# Patient Record
Sex: Female | Born: 1970 | Race: White | Hispanic: Yes | Marital: Married | Smoking: Never smoker
Health system: Southern US, Community
[De-identification: ages and names within clinical notes are randomized; demographics above are authoritative.]

## PROBLEM LIST (undated history)

## (undated) ENCOUNTER — Emergency Department (HOSPITAL_COMMUNITY): Admission: EM | Payer: Self-pay | Source: Home / Self Care

## (undated) DIAGNOSIS — I10 Essential (primary) hypertension: Secondary | ICD-10-CM

## (undated) DIAGNOSIS — E119 Type 2 diabetes mellitus without complications: Secondary | ICD-10-CM

## (undated) HISTORY — PX: TUBAL LIGATION: SHX77

---

## 2010-10-06 HISTORY — PX: APPENDECTOMY: SHX54

## 2011-12-25 ENCOUNTER — Other Ambulatory Visit (HOSPITAL_COMMUNITY): Payer: Self-pay | Admitting: Family Medicine

## 2011-12-25 DIAGNOSIS — Z139 Encounter for screening, unspecified: Secondary | ICD-10-CM

## 2012-01-01 ENCOUNTER — Ambulatory Visit (HOSPITAL_COMMUNITY)
Admission: RE | Admit: 2012-01-01 | Discharge: 2012-01-01 | Disposition: A | Discharge status: Home / Self Care | Payer: Self-pay | Source: Ambulatory Visit | Attending: Family Medicine | Admitting: Family Medicine

## 2012-01-01 DIAGNOSIS — Z139 Encounter for screening, unspecified: Secondary | ICD-10-CM

## 2012-01-05 ENCOUNTER — Other Ambulatory Visit: Payer: Self-pay | Admitting: Family Medicine

## 2012-01-05 DIAGNOSIS — R928 Other abnormal and inconclusive findings on diagnostic imaging of breast: Secondary | ICD-10-CM

## 2012-01-26 ENCOUNTER — Other Ambulatory Visit (HOSPITAL_BASED_OUTPATIENT_CLINIC_OR_DEPARTMENT_OTHER): Payer: Self-pay | Admitting: Internal Medicine

## 2012-01-30 ENCOUNTER — Other Ambulatory Visit (HOSPITAL_COMMUNITY): Payer: Self-pay | Admitting: Family Medicine

## 2012-01-30 DIAGNOSIS — R928 Other abnormal and inconclusive findings on diagnostic imaging of breast: Secondary | ICD-10-CM

## 2012-02-04 ENCOUNTER — Other Ambulatory Visit (HOSPITAL_COMMUNITY): Payer: Self-pay | Admitting: Family Medicine

## 2012-02-04 ENCOUNTER — Ambulatory Visit (HOSPITAL_COMMUNITY)
Admission: RE | Admit: 2012-02-04 | Discharge: 2012-02-04 | Disposition: A | Discharge status: Home / Self Care | Payer: PRIVATE HEALTH INSURANCE | Source: Ambulatory Visit | Attending: Family Medicine | Admitting: Family Medicine

## 2012-02-04 DIAGNOSIS — R928 Other abnormal and inconclusive findings on diagnostic imaging of breast: Secondary | ICD-10-CM | POA: Insufficient documentation

## 2012-02-04 DIAGNOSIS — N6459 Other signs and symptoms in breast: Secondary | ICD-10-CM | POA: Insufficient documentation

## 2013-05-02 ENCOUNTER — Other Ambulatory Visit (HOSPITAL_COMMUNITY): Payer: Self-pay | Admitting: *Deleted

## 2013-05-02 DIAGNOSIS — Z1231 Encounter for screening mammogram for malignant neoplasm of breast: Secondary | ICD-10-CM

## 2013-05-04 ENCOUNTER — Other Ambulatory Visit (HOSPITAL_COMMUNITY): Payer: Self-pay | Admitting: *Deleted

## 2013-05-04 DIAGNOSIS — R928 Other abnormal and inconclusive findings on diagnostic imaging of breast: Secondary | ICD-10-CM

## 2013-05-04 DIAGNOSIS — Z139 Encounter for screening, unspecified: Secondary | ICD-10-CM

## 2013-05-11 ENCOUNTER — Other Ambulatory Visit (HOSPITAL_COMMUNITY): Payer: Self-pay | Admitting: *Deleted

## 2013-05-11 ENCOUNTER — Ambulatory Visit (HOSPITAL_COMMUNITY)
Admission: RE | Admit: 2013-05-11 | Discharge: 2013-05-11 | Disposition: A | Discharge status: Home / Self Care | Payer: PRIVATE HEALTH INSURANCE | Source: Ambulatory Visit | Attending: *Deleted | Admitting: *Deleted

## 2013-05-11 DIAGNOSIS — R928 Other abnormal and inconclusive findings on diagnostic imaging of breast: Secondary | ICD-10-CM

## 2013-05-11 DIAGNOSIS — N63 Unspecified lump in unspecified breast: Secondary | ICD-10-CM | POA: Insufficient documentation

## 2013-05-11 DIAGNOSIS — Z09 Encounter for follow-up examination after completed treatment for conditions other than malignant neoplasm: Secondary | ICD-10-CM | POA: Insufficient documentation

## 2013-11-14 ENCOUNTER — Other Ambulatory Visit (HOSPITAL_COMMUNITY): Payer: Self-pay | Admitting: Nurse Practitioner

## 2013-11-14 DIAGNOSIS — R229 Localized swelling, mass and lump, unspecified: Principal | ICD-10-CM

## 2013-11-14 DIAGNOSIS — IMO0002 Reserved for concepts with insufficient information to code with codable children: Secondary | ICD-10-CM

## 2013-11-16 ENCOUNTER — Other Ambulatory Visit (HOSPITAL_COMMUNITY): Payer: Self-pay | Admitting: Nurse Practitioner

## 2013-11-16 ENCOUNTER — Ambulatory Visit (HOSPITAL_COMMUNITY)
Admission: RE | Admit: 2013-11-16 | Discharge: 2013-11-16 | Disposition: A | Discharge status: Home / Self Care | Payer: PRIVATE HEALTH INSURANCE | Source: Ambulatory Visit | Attending: Nurse Practitioner | Admitting: Nurse Practitioner

## 2013-11-16 DIAGNOSIS — N63 Unspecified lump in unspecified breast: Secondary | ICD-10-CM | POA: Insufficient documentation

## 2013-11-16 DIAGNOSIS — R229 Localized swelling, mass and lump, unspecified: Secondary | ICD-10-CM

## 2013-11-16 DIAGNOSIS — IMO0002 Reserved for concepts with insufficient information to code with codable children: Secondary | ICD-10-CM

## 2013-11-16 DIAGNOSIS — Z09 Encounter for follow-up examination after completed treatment for conditions other than malignant neoplasm: Secondary | ICD-10-CM | POA: Insufficient documentation

## 2014-05-04 ENCOUNTER — Other Ambulatory Visit (HOSPITAL_COMMUNITY): Payer: Self-pay | Admitting: *Deleted

## 2014-05-04 DIAGNOSIS — Z1231 Encounter for screening mammogram for malignant neoplasm of breast: Secondary | ICD-10-CM

## 2014-05-15 ENCOUNTER — Ambulatory Visit (HOSPITAL_COMMUNITY): Payer: Self-pay

## 2014-05-18 ENCOUNTER — Ambulatory Visit (HOSPITAL_COMMUNITY)
Admission: RE | Admit: 2014-05-18 | Discharge: 2014-05-18 | Disposition: A | Discharge status: Home / Self Care | Payer: PRIVATE HEALTH INSURANCE | Source: Ambulatory Visit | Attending: *Deleted | Admitting: *Deleted

## 2014-05-18 DIAGNOSIS — Z1231 Encounter for screening mammogram for malignant neoplasm of breast: Secondary | ICD-10-CM | POA: Insufficient documentation

## 2015-07-10 ENCOUNTER — Other Ambulatory Visit (HOSPITAL_COMMUNITY): Payer: Self-pay | Admitting: *Deleted

## 2015-07-10 DIAGNOSIS — Z1231 Encounter for screening mammogram for malignant neoplasm of breast: Secondary | ICD-10-CM

## 2015-07-18 ENCOUNTER — Ambulatory Visit (HOSPITAL_COMMUNITY)
Admission: RE | Admit: 2015-07-18 | Discharge: 2015-07-18 | Disposition: A | Discharge status: Home / Self Care | Payer: Self-pay | Source: Ambulatory Visit | Attending: *Deleted | Admitting: *Deleted

## 2015-07-18 DIAGNOSIS — Z1231 Encounter for screening mammogram for malignant neoplasm of breast: Secondary | ICD-10-CM

## 2016-07-17 ENCOUNTER — Other Ambulatory Visit (HOSPITAL_COMMUNITY): Payer: Self-pay | Admitting: *Deleted

## 2016-07-17 DIAGNOSIS — Z1231 Encounter for screening mammogram for malignant neoplasm of breast: Secondary | ICD-10-CM

## 2016-07-23 ENCOUNTER — Ambulatory Visit (HOSPITAL_COMMUNITY)
Admission: RE | Admit: 2016-07-23 | Discharge: 2016-07-23 | Disposition: A | Discharge status: Home / Self Care | Payer: Self-pay | Source: Ambulatory Visit | Attending: *Deleted | Admitting: *Deleted

## 2016-07-23 DIAGNOSIS — Z1231 Encounter for screening mammogram for malignant neoplasm of breast: Secondary | ICD-10-CM

## 2017-08-11 ENCOUNTER — Other Ambulatory Visit (HOSPITAL_COMMUNITY): Payer: Self-pay | Admitting: *Deleted

## 2017-08-11 DIAGNOSIS — Z1231 Encounter for screening mammogram for malignant neoplasm of breast: Secondary | ICD-10-CM

## 2017-08-20 ENCOUNTER — Ambulatory Visit (HOSPITAL_COMMUNITY): Payer: Self-pay

## 2017-08-24 ENCOUNTER — Encounter (HOSPITAL_COMMUNITY): Payer: Self-pay

## 2017-08-24 ENCOUNTER — Ambulatory Visit (HOSPITAL_COMMUNITY)
Admission: RE | Admit: 2017-08-24 | Discharge: 2017-08-24 | Disposition: A | Discharge status: Home / Self Care | Payer: PRIVATE HEALTH INSURANCE | Source: Ambulatory Visit | Attending: *Deleted | Admitting: *Deleted

## 2017-08-24 DIAGNOSIS — Z1231 Encounter for screening mammogram for malignant neoplasm of breast: Secondary | ICD-10-CM

## 2019-05-02 ENCOUNTER — Encounter (HOSPITAL_COMMUNITY): Payer: Self-pay | Admitting: Emergency Medicine

## 2019-05-02 ENCOUNTER — Emergency Department (HOSPITAL_COMMUNITY): Payer: Self-pay

## 2019-05-02 ENCOUNTER — Other Ambulatory Visit: Payer: Self-pay

## 2019-05-02 ENCOUNTER — Emergency Department (HOSPITAL_COMMUNITY)
Admission: EM | Admit: 2019-05-02 | Discharge: 2019-05-02 | Disposition: A | Discharge status: Home / Self Care | Payer: Self-pay | Attending: Emergency Medicine | Admitting: Emergency Medicine

## 2019-05-02 DIAGNOSIS — Z79899 Other long term (current) drug therapy: Secondary | ICD-10-CM | POA: Insufficient documentation

## 2019-05-02 DIAGNOSIS — I10 Essential (primary) hypertension: Secondary | ICD-10-CM | POA: Insufficient documentation

## 2019-05-02 DIAGNOSIS — E119 Type 2 diabetes mellitus without complications: Secondary | ICD-10-CM | POA: Insufficient documentation

## 2019-05-02 DIAGNOSIS — Z7984 Long term (current) use of oral hypoglycemic drugs: Secondary | ICD-10-CM | POA: Insufficient documentation

## 2019-05-02 DIAGNOSIS — R002 Palpitations: Secondary | ICD-10-CM | POA: Insufficient documentation

## 2019-05-02 HISTORY — DX: Essential (primary) hypertension: I10

## 2019-05-02 HISTORY — DX: Type 2 diabetes mellitus without complications: E11.9

## 2019-05-02 LAB — CBC WITH DIFFERENTIAL/PLATELET
Abs Immature Granulocytes: 0.06 10*3/uL (ref 0.00–0.07)
Basophils Absolute: 0.1 10*3/uL (ref 0.0–0.1)
Basophils Relative: 1 %
Eosinophils Absolute: 0.5 10*3/uL (ref 0.0–0.5)
Eosinophils Relative: 4 %
HCT: 37.2 % (ref 36.0–46.0)
Hemoglobin: 11.7 g/dL — ABNORMAL LOW (ref 12.0–15.0)
Immature Granulocytes: 1 %
Lymphocytes Relative: 22 %
Lymphs Abs: 2.6 10*3/uL (ref 0.7–4.0)
MCH: 24.8 pg — ABNORMAL LOW (ref 26.0–34.0)
MCHC: 31.5 g/dL (ref 30.0–36.0)
MCV: 78.8 fL — ABNORMAL LOW (ref 80.0–100.0)
Monocytes Absolute: 0.6 10*3/uL (ref 0.1–1.0)
Monocytes Relative: 5 %
Neutro Abs: 8.1 10*3/uL — ABNORMAL HIGH (ref 1.7–7.7)
Neutrophils Relative %: 67 %
Platelets: 417 10*3/uL — ABNORMAL HIGH (ref 150–400)
RBC: 4.72 MIL/uL (ref 3.87–5.11)
RDW: 14.2 % (ref 11.5–15.5)
WBC: 11.9 10*3/uL — ABNORMAL HIGH (ref 4.0–10.5)
nRBC: 0 % (ref 0.0–0.2)

## 2019-05-02 LAB — I-STAT BETA HCG BLOOD, ED (MC, WL, AP ONLY): I-stat hCG, quantitative: <5 m[IU]/mL (ref <5)

## 2019-05-02 LAB — BASIC METABOLIC PANEL
Anion gap: 12 (ref 5–15)
BUN: 11 mg/dL (ref 6–20)
CO2: 25 mmol/L (ref 22–32)
Calcium: 9.6 mg/dL (ref 8.9–10.3)
Chloride: 98 mmol/L (ref 98–111)
Creatinine, Ser: 0.39 mg/dL — ABNORMAL LOW (ref 0.44–1.00)
GFR calc Af Amer: >60 mL/min (ref >60)
GFR calc non Af Amer: >60 mL/min (ref >60)
Glucose, Bld: 222 mg/dL — ABNORMAL HIGH (ref 70–99)
Potassium: 3.9 mmol/L (ref 3.5–5.1)
Sodium: 135 mmol/L (ref 135–145)

## 2019-05-02 LAB — MAGNESIUM: Magnesium: 1.4 mg/dL — ABNORMAL LOW (ref 1.7–2.4)

## 2019-05-02 MED ORDER — MAGNESIUM CHLORIDE 64 MG PO TBEC: 2.0000 | DELAYED_RELEASE_TABLET | Freq: Two times a day (BID) | ORAL | 0 refills | Status: AC

## 2019-05-02 NOTE — ED Provider Notes (Signed)
[facility] EMERGENCY DEPARTMENT Provider Note   CSN: [id number] Arrival date & time: 05/02/19  1232    History   Chief Complaint Chief Complaint  Patient presents with  . Palpitations    HPI Kaitlyn Bell is a 48 y.o. female.     The history is provided by the patient. A language interpreter was used ([place]).     Kaitlyn Bell is a 48 y.o. female, with a history of DM and HTN, presenting to the ED with two episodes of rapid heart rate.  She has experienced 2 such episodes.  The first episode woke her up from sleep about 2 weeks ago and she estimates this lasted for about 30 minutes.  It had sudden onset and sudden resolution.   The second episode occurred about a week ago while at rest and lasted for approximately 5 minutes.  She denies any changes in activity.  No new medications or supplements.  She drinks a diet cola about once every 3 days and this has not recently changed.  Denies other caffeine use.  She has been eating and drinking normally. LMP July 10. Denies fever/chills, N/V/D, chest pain, shortness of breath, cough, dizziness, headache, abdominal pain, hematochezia/melena, syncope, or any other complaints.  She denies the above symptoms currently as well as during her episodes of rapid heart rate.    Past Medical History:  Diagnosis Date  . Diabetes mellitus without complication ([place])   . Hypertension     There are no active problems to display for this patient.   Past Surgical History:  Procedure Laterality Date  . APPENDECTOMY  2012     OB History   No obstetric history on file.      Home Medications    Prior to Admission medications   Medication Sig Start Date End Date Taking? Authorizing Provider  glipiZIDE (GLUCOTROL) 10 MG tablet Take 10 mg by mouth 2 (two) times daily before a meal.   Yes [provider]  lisinopril (ZESTRIL) 5 MG tablet Take 5 mg by mouth daily.   Yes [provider]   simvastatin (ZOCOR) 10 MG tablet Take 10 mg by mouth daily.   Yes [provider]  sitaGLIPtin-metformin (JANUMET) 50-1000 MG tablet Take 2 tablets by mouth 2 (two) times daily with a meal.   Yes [provider]  magnesium chloride (SLOW-MAG) 64 MG TBEC SR tablet Take 2 tablets (128 mg total) by mouth 2 (two) times daily. 05/02/19 06/01/19  Lorayne Bender, PA-C    Family History History reviewed. No pertinent family history.  Social History Social History   Tobacco Use  . Smoking status: Never Smoker  . Smokeless tobacco: Never Used  Substance Use Topics  . Alcohol use: Never    Frequency: Never  . Drug use: Never     Allergies   Patient has no known allergies.   Review of Systems Review of Systems  Constitutional: Negative for chills and fever.  Respiratory: Negative for cough and shortness of breath.   Cardiovascular: Positive for palpitations. Negative for chest pain and leg swelling.  Gastrointestinal: Negative for abdominal pain, blood in stool, diarrhea, nausea and vomiting.  Endocrine: Negative for polyuria.  Genitourinary: Negative for dysuria, frequency and hematuria.  Neurological: Negative for dizziness, syncope, weakness, light-headedness and headaches.  All other systems reviewed and are negative.    Physical Exam Updated Vital Signs BP 138/82 (BP Location: Right Arm)   Pulse 94   Temp 97.9 F (36.6 C) (Oral)  Resp 18   Wt 57 kg   SpO2 100%   Physical Exam Vitals signs and nursing note reviewed.  Constitutional:      General: She is not in acute distress.    Appearance: She is well-developed. She is not diaphoretic.  HENT:     Head: Normocephalic and atraumatic.     Mouth/Throat:     Mouth: Mucous membranes are moist.     Pharynx: Oropharynx is clear.  Eyes:     Conjunctiva/sclera: Conjunctivae normal.  Neck:     Musculoskeletal: Neck supple.  Cardiovascular:     Rate and Rhythm: Normal rate and regular rhythm.     Pulses:  Normal pulses.          Radial pulses are 2+ on the right side and 2+ on the left side.       Posterior tibial pulses are 2+ on the right side and 2+ on the left side.     Heart sounds: Normal heart sounds.     Comments: Tactile temperature in the extremities appropriate and equal bilaterally. Pulmonary:     Effort: Pulmonary effort is normal. No respiratory distress.     Breath sounds: Normal breath sounds.  Abdominal:     Palpations: Abdomen is soft.     Tenderness: There is no abdominal tenderness. There is no guarding.  Musculoskeletal:     Right lower leg: No edema.     Left lower leg: No edema.  Lymphadenopathy:     Cervical: No cervical adenopathy.  Skin:    General: Skin is warm and dry.  Neurological:     Mental Status: She is alert.  Psychiatric:        Mood and Affect: Mood and affect normal.        Speech: Speech normal.        Behavior: Behavior normal.      ED Treatments / Results  Labs (all labs ordered are listed, but only abnormal results are displayed) Labs Reviewed  BASIC METABOLIC PANEL - Abnormal; Notable for the following components:      Result Value   Glucose, Bld 222 (*)    Creatinine, Ser 0.39 (*)    All other components within normal limits  CBC WITH DIFFERENTIAL/PLATELET - Abnormal; Notable for the following components:   WBC 11.9 (*)    Hemoglobin 11.7 (*)    MCV 78.8 (*)    MCH 24.8 (*)    Platelets 417 (*)    Neutro Abs 8.1 (*)    All other components within normal limits  MAGNESIUM - Abnormal; Notable for the following components:   Magnesium 1.4 (*)    All other components within normal limits  I-STAT BETA HCG BLOOD, ED ([facility], [facility], [facility] ONLY)    EKG EKG Interpretation  Date/Time:  Monday May 02 2019 12:53:33 EDT Ventricular Rate:  95 PR Interval:    QRS Duration: 84 QT Interval:  350 QTC Calculation: 440 R Axis:   66 Text Interpretation:  Sinus rhythm Baseline wander in lead(s) II Confirmed by Bethann BerkshireZammit, Joseph [phone]([id number]) on  05/02/2019 2:17:13 PM   Radiology Dg Chest 2 View  Result Date: 05/02/2019 CLINICAL DATA:  Pt reports having feeling of heart racing at times x 2 weeks. Hx HTN, DM EXAM: CHEST - 2 VIEW COMPARISON:  03/02/2008 FINDINGS: Heart size is normal. The lungs are clear. No pulmonary edema. Surgical clips are present in the RIGHT UPPER QUADRANT of the abdomen. IMPRESSION: No active cardiopulmonary disease. Electronically Signed  By: Norva PavlovElizabeth  Brown M.D.   On: 05/02/2019 13:54    Procedures Procedures (including critical care time)  Medications Ordered in ED Medications - No data to display   Initial Impression / Assessment and Plan / ED Course  I have reviewed the triage vital signs and the nursing notes.  Pertinent labs & imaging results that were available during my care of the patient were reviewed by me and considered in my medical decision making (see chart for details).        Patient presents with a complaint of episodic rapid heart rate.  These symptoms are nonexertional and self resolved. Patient is nontoxic appearing, afebrile, not tachycardic, not tachypneic, not hypotensive, maintains excellent SPO2 on room air, and is in no apparent distress.   Mild leukocytosis is nonspecific at this time, especially since patient was symptom-free throughout ED course. Mild anemia noted without previous values with which to compare.  Patient denies any source of persistent bleeding.  No thrombocytopenia. Hypomagnesemia of 1.4 with potassium and calcium within normal limits.   EKG without evidence of conduction delays or arrhythmia.  Magnesium supplementation prescription provided.  Patient advised to have retesting performed ideally after 1 to 2 weeks of supplementation.  PCP versus cardiology follow-up. The patient was given instructions for home care as well as return precautions. Patient voices understanding of these instructions, accepts the plan, and is comfortable with discharge.   2:39  PM Lab results, chest x-ray, and EKG as well as discharge instructions were discussed using Spanish interpreter De NurseMarco [phone].   Patient was offered testing for COVID-19 with explanation, but declined.   Vitals:   05/02/19 1330 05/02/19 1417 05/02/19 1430 05/02/19 1500  BP: 99/79 98/70 114/78 104/68  Pulse: 91 91 95 95  Resp: 19 18 18 19   Temp:      TempSrc:      SpO2: 99% 98% 97% 96%  Weight:         Final Clinical Impressions(s) / ED Diagnoses   Final diagnoses:  Palpitations  Hypomagnesemia    ED Discharge Orders         Ordered    magnesium chloride (SLOW-MAG) 64 MG TBEC SR tablet  2 times daily     05/02/19 1422           Anselm PancoastJoy, Shawn C, PA-C 05/02/19 1516    Bethann BerkshireZammit, Joseph, MD 05/04/19 [id number]

## 2019-05-02 NOTE — ED Triage Notes (Signed)
Pt reports having feeling of heart racing at times x 2 weeks.

## 2019-05-02 NOTE — Discharge Instructions (Signed)
You were seen today for episodes of fast heart rate.  Chest x-ray and EKG were reassuring.  There was some evidence of low magnesium.  We will have you begin magnesium supplementation.  This value will need to be retested within the next week or two. We recommend you follow-up with your primary care provider on this matter as soon as possible.  May also need to follow-up with cardiology should symptoms persist.  Return to the emergency department for sustained symptoms, passing out, chest pain, shortness of breath, dizziness, or any other major concerns.

## 2019-11-07 ENCOUNTER — Ambulatory Visit: Payer: PRIVATE HEALTH INSURANCE | Attending: Internal Medicine

## 2019-11-07 ENCOUNTER — Other Ambulatory Visit: Payer: Self-pay

## 2019-11-07 DIAGNOSIS — Z20822 Contact with and (suspected) exposure to covid-19: Secondary | ICD-10-CM

## 2019-11-08 ENCOUNTER — Telehealth: Payer: Self-pay

## 2019-11-08 LAB — NOVEL CORONAVIRUS, NAA: SARS-CoV-2, NAA: NOT DETECTED

## 2019-11-08 NOTE — Telephone Encounter (Signed)
Patient called with daughter to help. She was told that her test for COVID-19  11/07/19 was still pending.  Daughter verbalized understanding and will call back.

## 2019-11-09 ENCOUNTER — Telehealth: Payer: Self-pay

## 2019-11-09 NOTE — Telephone Encounter (Signed)
Pt notified of negative COVID-19 results. Understanding verbalized.  Chasta M Hopkins   

## 2019-12-13 ENCOUNTER — Other Ambulatory Visit (HOSPITAL_COMMUNITY): Payer: Self-pay | Admitting: *Deleted

## 2019-12-13 DIAGNOSIS — Z1231 Encounter for screening mammogram for malignant neoplasm of breast: Secondary | ICD-10-CM

## 2019-12-26 ENCOUNTER — Other Ambulatory Visit: Payer: Self-pay

## 2019-12-26 ENCOUNTER — Ambulatory Visit (HOSPITAL_COMMUNITY)
Admission: RE | Admit: 2019-12-26 | Discharge: 2019-12-26 | Disposition: A | Discharge status: Home / Self Care | Payer: PRIVATE HEALTH INSURANCE | Source: Ambulatory Visit | Attending: *Deleted | Admitting: *Deleted

## 2019-12-26 DIAGNOSIS — Z1231 Encounter for screening mammogram for malignant neoplasm of breast: Secondary | ICD-10-CM | POA: Insufficient documentation

## 2021-02-15 ENCOUNTER — Other Ambulatory Visit (HOSPITAL_COMMUNITY): Payer: Self-pay | Admitting: *Deleted

## 2021-02-15 DIAGNOSIS — Z1231 Encounter for screening mammogram for malignant neoplasm of breast: Secondary | ICD-10-CM

## 2021-02-25 ENCOUNTER — Other Ambulatory Visit: Payer: Self-pay

## 2021-02-25 ENCOUNTER — Ambulatory Visit (HOSPITAL_COMMUNITY)
Admission: RE | Admit: 2021-02-25 | Discharge: 2021-02-25 | Disposition: A | Discharge status: Home / Self Care | Payer: Self-pay | Source: Ambulatory Visit | Attending: *Deleted | Admitting: *Deleted

## 2021-02-25 DIAGNOSIS — Z1231 Encounter for screening mammogram for malignant neoplasm of breast: Secondary | ICD-10-CM | POA: Insufficient documentation

## 2022-01-01 ENCOUNTER — Ambulatory Visit: Payer: Self-pay | Admitting: Family Medicine

## 2022-05-20 ENCOUNTER — Other Ambulatory Visit (HOSPITAL_COMMUNITY): Payer: Self-pay | Admitting: Obstetrics and Gynecology

## 2022-05-20 DIAGNOSIS — Z1231 Encounter for screening mammogram for malignant neoplasm of breast: Secondary | ICD-10-CM

## 2022-06-20 ENCOUNTER — Inpatient Hospital Stay: Payer: Self-pay | Attending: Obstetrics and Gynecology | Admitting: Hematology and Oncology

## 2022-06-20 ENCOUNTER — Ambulatory Visit (HOSPITAL_COMMUNITY): Admission: RE | Admit: 2022-06-20 | Payer: Self-pay | Source: Ambulatory Visit

## 2022-06-20 VITALS — BP 106/70 | Wt 156.0 lb

## 2022-06-20 DIAGNOSIS — Z1231 Encounter for screening mammogram for malignant neoplasm of breast: Secondary | ICD-10-CM

## 2022-06-20 NOTE — Progress Notes (Signed)
Kaitlyn Bell is a 51 y.o. female who presents to [facility] clinic today with no complaints .    Pap Smear: Pap not smear completed today. Last Pap smear was 12/13/2019 at [facility] Department clinic and was normal. Per patient has no history of an abnormal Pap smear. Last Pap smear result is not available in Epic.   Physical exam: Breasts Breasts symmetrical. No skin abnormalities bilateral breasts. No nipple retraction bilateral breasts. No nipple discharge bilateral breasts. No lymphadenopathy. No lumps palpated bilateral breasts.       Pelvic/Bimanual Pap is not indicated today    Smoking History: Patient has never smoked and was not referred to quit line.    Patient Navigation: Patient education provided. Access to services provided for patient through [facility] program. No interpreter provided. No transportation provided   Colorectal Cancer Screening: Per patient has never had colonoscopy completed No complaints today. Refused FIT test today.   Breast and Cervical Cancer Risk Assessment: Patient does not have family history of breast cancer, known genetic mutations, or radiation treatment to the chest before age 35. Patient does not have history of cervical dysplasia, immunocompromised, or DES exposure in-utero.  Risk Assessment     Risk Scores       06/20/2022   Last edited by: Narda Rutherford, LPN   5-year risk: 0.6 %   Lifetime risk: 5.7 %            A: B[facility]CP exam without pap smear No complaints with benign exam.   P: [facility]. Appointment scheduled for 06/20/2022.  Pascal Lux, NP 06/20/2022 11:41 AM

## 2022-07-09 ENCOUNTER — Ambulatory Visit (HOSPITAL_COMMUNITY)
Admission: RE | Admit: 2022-07-09 | Discharge: 2022-07-09 | Disposition: A | Discharge status: Home / Self Care | Payer: Self-pay | Source: Ambulatory Visit | Attending: Obstetrics and Gynecology | Admitting: Obstetrics and Gynecology

## 2022-07-09 DIAGNOSIS — Z1231 Encounter for screening mammogram for malignant neoplasm of breast: Secondary | ICD-10-CM | POA: Insufficient documentation

## 2022-09-06 IMAGING — MG MM DIGITAL SCREENING BILAT W/ TOMO AND CAD
8 series · 8 of 24 positions shown · non-contrast
Comparison: Previous exam(s).

CLINICAL DATA: Screening.

EXAM:
DIGITAL SCREENING BILATERAL MAMMOGRAM WITH TOMOSYNTHESIS AND CAD
TECHNIQUE: Bilateral screening digital craniocaudal and mediolateral oblique
mammograms were obtained. Bilateral screening digital breast
tomosynthesis was performed. The images were evaluated with
computer-aided detection.

[L MLO synth-2D]
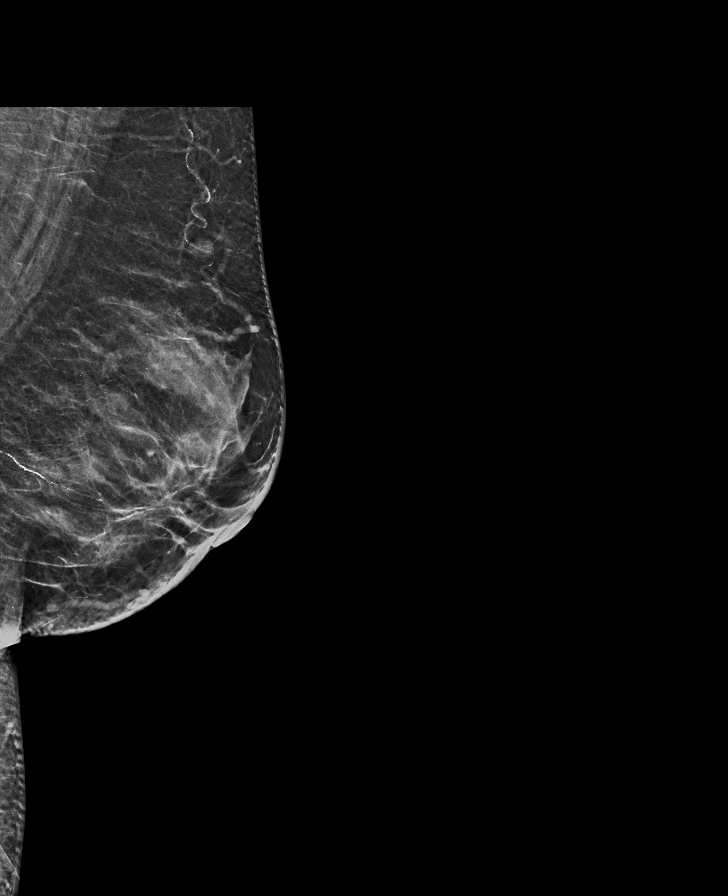

[L CC synth-2D]
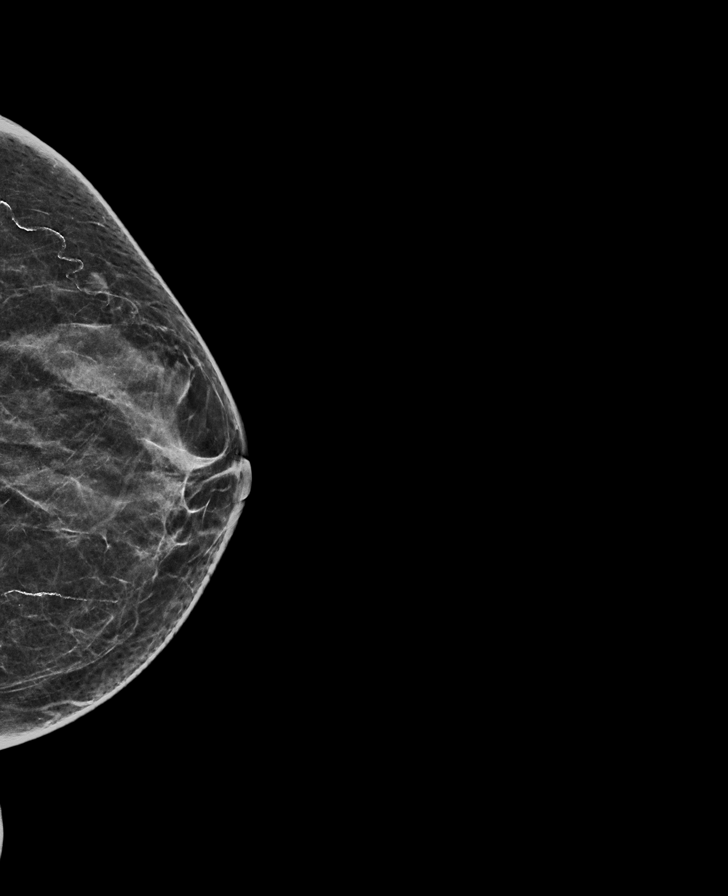

[R MLO synth-2D]
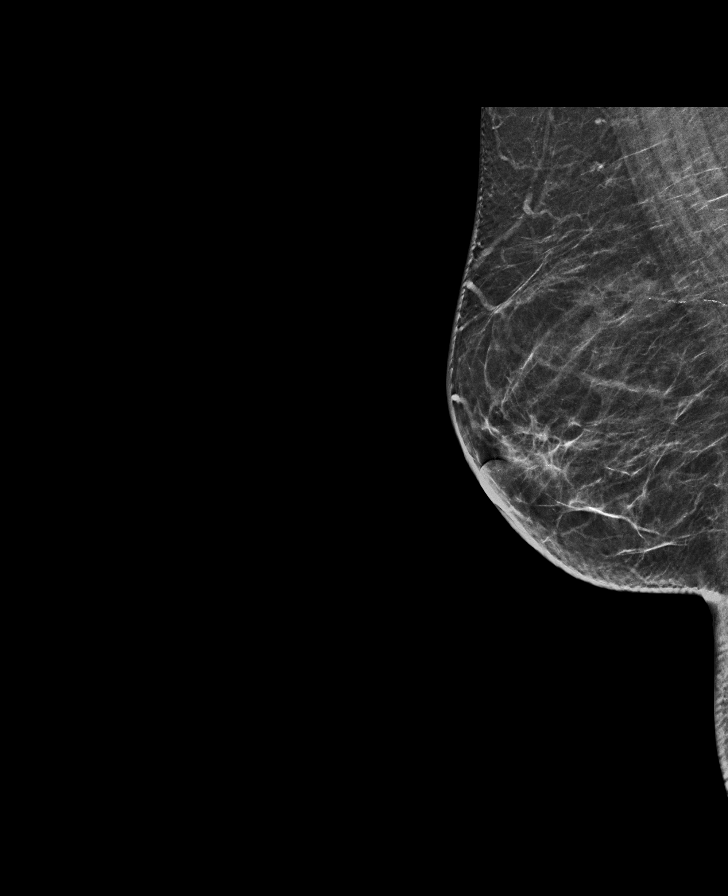

[R CC synth-2D]
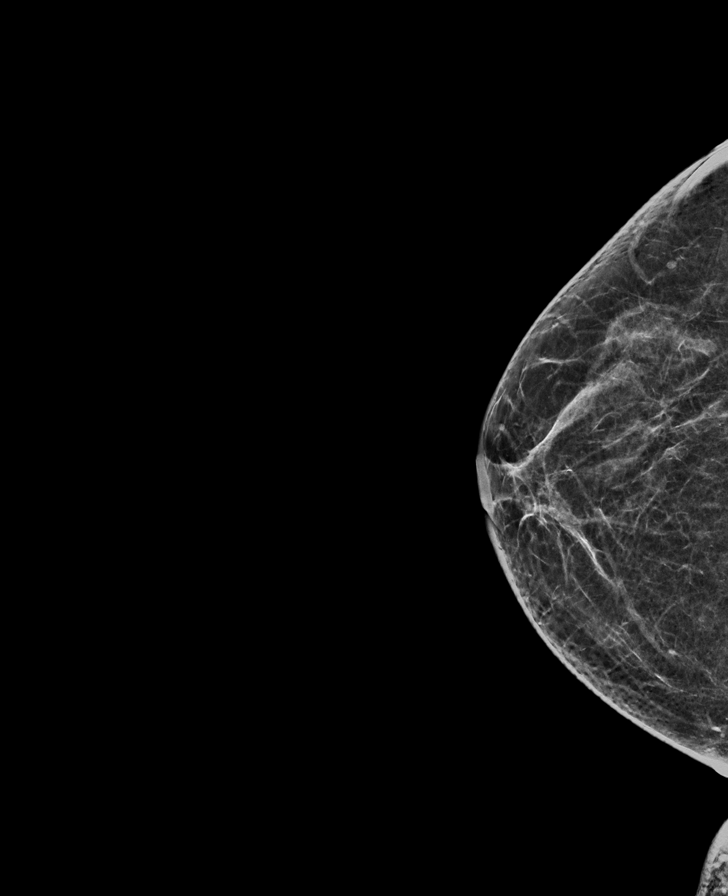

[R MLO tomo · tomo slice 29/56.0]
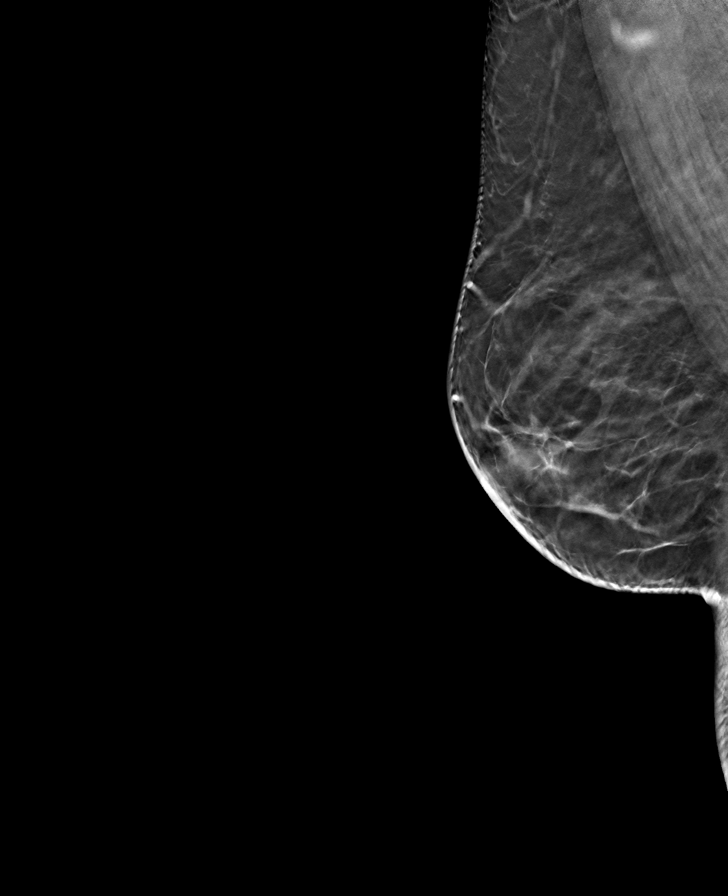

[L MLO tomo · tomo slice 29/57.0]
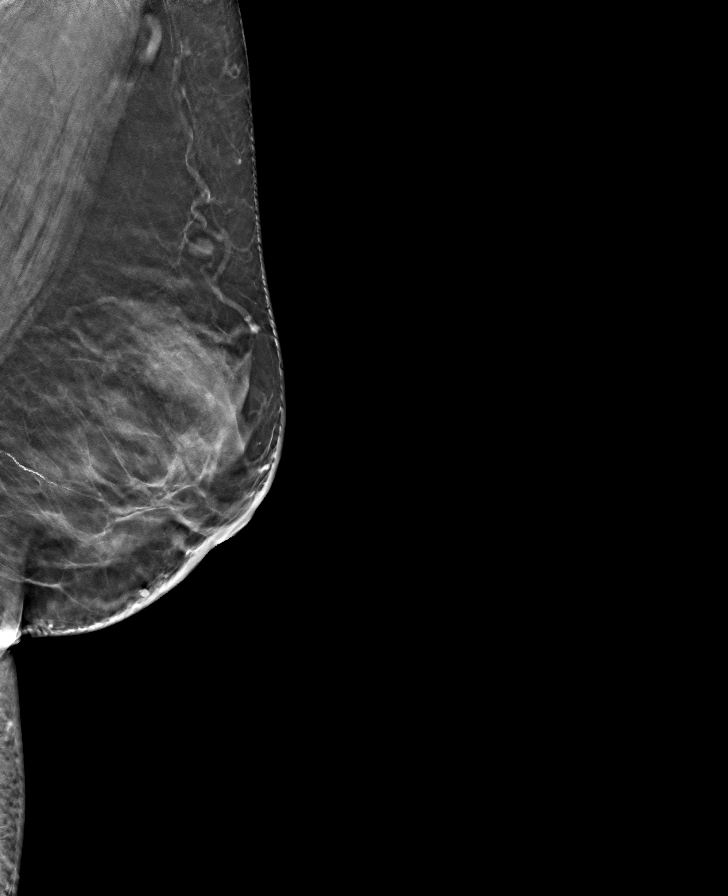

[L CC tomo · tomo slice 29/56.0]
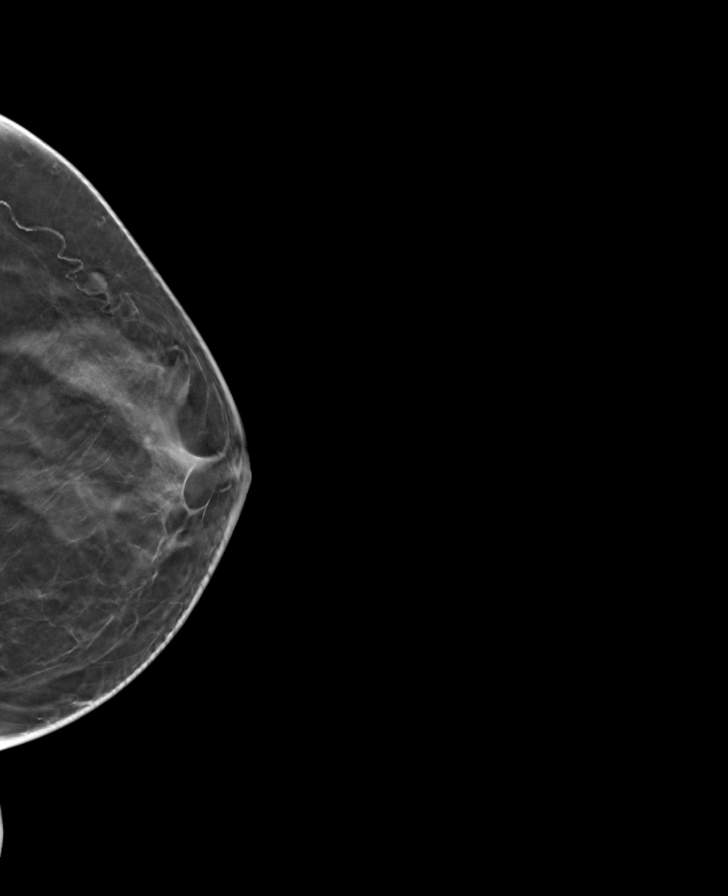

[R CC tomo · tomo slice 27/54.0]
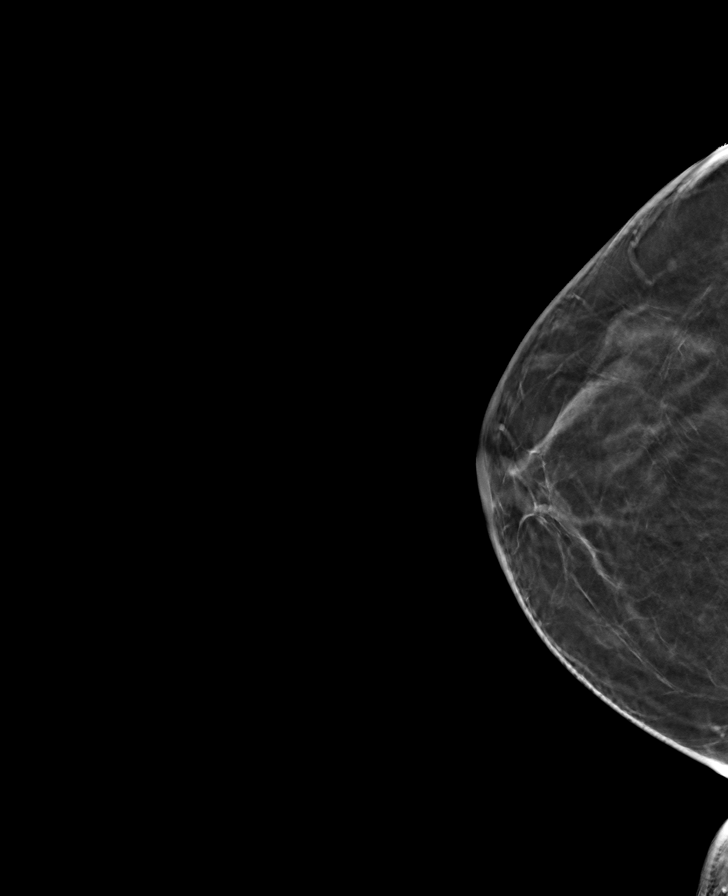

[8 of 24 positions shown; findings below may reference images not displayed]

ACR Breast Density Category c: The breast tissue is heterogeneously
dense, which may obscure small masses.
FINDINGS: There are no findings suspicious for malignancy. The images were
evaluated with computer-aided detection.
IMPRESSION: No mammographic evidence of malignancy. A result letter of this
screening mammogram will be mailed directly to the patient.

RECOMMENDATION:
Screening mammogram in one year. (Code:T4-5-GWO)

BI-RADS CATEGORY  1: Negative.

## 2023-02-24 ENCOUNTER — Emergency Department (HOSPITAL_COMMUNITY)
Admission: EM | Admit: 2023-02-24 | Discharge: 2023-02-24 | Disposition: A | Discharge status: Home / Self Care | Payer: Self-pay | Attending: Emergency Medicine | Admitting: Emergency Medicine

## 2023-02-24 ENCOUNTER — Other Ambulatory Visit: Payer: Self-pay

## 2023-02-24 DIAGNOSIS — E871 Hypo-osmolality and hyponatremia: Secondary | ICD-10-CM | POA: Insufficient documentation

## 2023-02-24 DIAGNOSIS — J020 Streptococcal pharyngitis: Secondary | ICD-10-CM | POA: Insufficient documentation

## 2023-02-24 DIAGNOSIS — Z794 Long term (current) use of insulin: Secondary | ICD-10-CM | POA: Insufficient documentation

## 2023-02-24 DIAGNOSIS — D72829 Elevated white blood cell count, unspecified: Secondary | ICD-10-CM | POA: Insufficient documentation

## 2023-02-24 LAB — BASIC METABOLIC PANEL
Anion gap: 10 (ref 5–15)
BUN: 13 mg/dL (ref 6–20)
CO2: 25 mmol/L (ref 22–32)
Calcium: 9.4 mg/dL (ref 8.9–10.3)
Chloride: 96 mmol/L — ABNORMAL LOW (ref 98–111)
Creatinine, Ser: 0.43 mg/dL — ABNORMAL LOW (ref 0.44–1.00)
GFR, Estimated: >60 mL/min (ref >60)
Glucose, Bld: 180 mg/dL — ABNORMAL HIGH (ref 70–99)
Potassium: 3.7 mmol/L (ref 3.5–5.1)
Sodium: 131 mmol/L — ABNORMAL LOW (ref 135–145)

## 2023-02-24 LAB — CBC WITH DIFFERENTIAL/PLATELET
Abs Immature Granulocytes: 0.15 10*3/uL — ABNORMAL HIGH (ref 0.00–0.07)
Basophils Absolute: 0.1 10*3/uL (ref 0.0–0.1)
Basophils Relative: 1 %
Eosinophils Absolute: 0.6 10*3/uL — ABNORMAL HIGH (ref 0.0–0.5)
Eosinophils Relative: 4 %
HCT: 36.6 % (ref 36.0–46.0)
Hemoglobin: 12.2 g/dL (ref 12.0–15.0)
Immature Granulocytes: 1 %
Lymphocytes Relative: 21 %
Lymphs Abs: 3.1 10*3/uL (ref 0.7–4.0)
MCH: 27.7 pg (ref 26.0–34.0)
MCHC: 33.3 g/dL (ref 30.0–36.0)
MCV: 83 fL (ref 80.0–100.0)
Monocytes Absolute: 0.9 10*3/uL (ref 0.1–1.0)
Monocytes Relative: 6 %
Neutro Abs: 10.2 10*3/uL — ABNORMAL HIGH (ref 1.7–7.7)
Neutrophils Relative %: 67 %
Platelets: 381 10*3/uL (ref 150–400)
RBC: 4.41 MIL/uL (ref 3.87–5.11)
RDW: 13.7 % (ref 11.5–15.5)
WBC: 15 10*3/uL — ABNORMAL HIGH (ref 4.0–10.5)
nRBC: 0 % (ref 0.0–0.2)

## 2023-02-24 LAB — GROUP A STREP BY PCR: Group A Strep by PCR: DETECTED — AB

## 2023-02-24 MED ORDER — SODIUM CHLORIDE 0.9 % IV BOLUS
1000.0000 mL | Freq: Once | INTRAVENOUS | Status: AC
  Administered 2023-02-24: 1000 mL via INTRAVENOUS

## 2023-02-24 MED ORDER — PENICILLIN G BENZATHINE 1200000 UNIT/2ML IM SUSY
1.2000 10*6.[IU] | PREFILLED_SYRINGE | Freq: Once | INTRAMUSCULAR | Status: AC
  Administered 2023-02-24: 1.2 10*6.[IU] via INTRAMUSCULAR
  Filled 2023-02-24: qty 2

## 2023-02-24 MED ORDER — DEXAMETHASONE SODIUM PHOSPHATE 10 MG/ML IJ SOLN
8.0000 mg | Freq: Once | INTRAMUSCULAR | Status: AC
  Administered 2023-02-24: 8 mg via INTRAVENOUS
  Filled 2023-02-24: qty 1

## 2023-02-24 NOTE — ED Provider Notes (Signed)
[place] EMERGENCY DEPARTMENT AT [facility] Provider Note   CSN: [id number] Arrival date & time: 02/24/23  1508     History  Chief Complaint  Patient presents with   Sore Throat    Kaitlyn Bell is a 52 y.o. female presenting for treatment of dehydration and sore throat.  She was seen by her primary provider today at which time she had a strep test performed and it was negative.  She has had decreased p.o. intake secondary to this 6-day course of sore throat, making it difficult for her to swallow.  In their office she had orthostatic vital signs and she was sent here for IV fluids.  She denies headache, URI type symptoms such as rhinorrhea, cough, has no shortness of breath, chest pain, nausea or vomiting.    She has taken ibuprofen with some improvement in her throat pain although transient.  The history is provided by the patient and a relative (adult dg at bedside.).       Home Medications Prior to Admission medications   Medication Sig Start Date End Date Taking? Authorizing Provider  glipiZIDE (GLUCOTROL) 10 MG tablet Take 10 mg by mouth 2 (two) times daily before a meal.    [provider]  insulin glargine (LANTUS) 100 UNIT/ML injection Inject 20 Units into the skin daily.    [provider]  lisinopril (ZESTRIL) 5 MG tablet Take 5 mg by mouth daily.    [provider]  simvastatin (ZOCOR) 10 MG tablet Take 10 mg by mouth daily.    [provider]  sitaGLIPtin-metformin (JANUMET) 50-1000 MG tablet Take 2 tablets by mouth 2 (two) times daily with a meal.    [provider]      Allergies    Patient has no known allergies.    Review of Systems   Review of Systems  Constitutional:  Positive for fatigue. Negative for chills and fever.  HENT:  Positive for sore throat and trouble swallowing. Negative for congestion.   Eyes: Negative.   Respiratory:  Negative for chest tightness and shortness of  breath.   Cardiovascular:  Negative for chest pain.  Gastrointestinal:  Negative for abdominal pain, nausea and vomiting.  Genitourinary: Negative.   Musculoskeletal:  Negative for arthralgias, joint swelling and neck pain.  Skin: Negative.  Negative for rash and wound.  Neurological:  Positive for weakness. Negative for dizziness, light-headedness, numbness and headaches.  Psychiatric/Behavioral: Negative.      Physical Exam Updated Vital Signs BP 122/79   Pulse 88   Temp 98.5 F (36.9 C) (Oral)   Resp 16   SpO2 98%  Physical Exam Constitutional:      Appearance: She is well-developed.  HENT:     Head: Normocephalic and atraumatic.     Right Ear: Tympanic membrane and ear canal normal.     Left Ear: Tympanic membrane and ear canal normal.     Nose: Mucosal edema present. No rhinorrhea.     Mouth/Throat:     Mouth: Mucous membranes are moist.     Pharynx: Oropharynx is clear. Uvula midline. Posterior oropharyngeal erythema present. No oropharyngeal exudate or uvula swelling.     Tonsils: No tonsillar abscesses. 2+ on the right. 2+ on the left.  Eyes:     Conjunctiva/sclera: Conjunctivae normal.  Cardiovascular:     Rate and Rhythm: Normal rate.     Heart sounds: Normal heart sounds.  Pulmonary:     Effort: Pulmonary effort is normal. No respiratory  distress.     Breath sounds: No wheezing or rales.  Abdominal:     Palpations: Abdomen is soft.     Tenderness: There is no abdominal tenderness.  Musculoskeletal:        General: Normal range of motion.  Lymphadenopathy:     Head:     Right side of head: Tonsillar adenopathy present.     Left side of head: Tonsillar adenopathy present.  Skin:    General: Skin is warm and dry.     Findings: No rash.  Neurological:     Mental Status: She is alert and oriented to person, place, and time.     ED Results / Procedures / Treatments   Labs (all labs ordered are listed, but only abnormal results are displayed) Labs  Reviewed  GROUP A STREP BY PCR - Abnormal; Notable for the following components:      Result Value   Group A Strep by PCR DETECTED (*)    All other components within normal limits  CBC WITH DIFFERENTIAL/PLATELET - Abnormal; Notable for the following components:   WBC 15.0 (*)    Neutro Abs 10.2 (*)    Eosinophils Absolute 0.6 (*)    Abs Immature Granulocytes 0.15 (*)    All other components within normal limits  BASIC METABOLIC PANEL - Abnormal; Notable for the following components:   Sodium 131 (*)    Chloride 96 (*)    Glucose, Bld 180 (*)    Creatinine, Ser 0.43 (*)    All other components within normal limits    EKG None  Radiology No results found.  Procedures Procedures    Medications Ordered in ED Medications  sodium chloride 0.9 % bolus 1,000 mL (0 mLs Intravenous Stopped 02/24/23 1740)  penicillin g benzathine (BICILLIN LA) 1200000 UNIT/2ML injection 1.2 Million Units (1.2 Million Units Intramuscular Given 02/24/23 1926)  dexamethasone (DECADRON) injection 8 mg (8 mg Intravenous Given 02/24/23 1926)    ED Course/ Medical Decision Making/ A&P                             Medical Decision Making Patient was given a liter of normal saline while we are awaiting lab results including a repeat strep test which is positive here.  She does have a significant leukocytosis with a white count of 15.0, she has a mild hyponatremia at 131, patient is a diabetic, her glucose is 180 today.  There is no anion gap.  Her BUN and creatinine are reassuring for no profound anemia or acute kidney injury.  We discussed treatment for strep throat, patient prefers IM injection, she was given Bicillin LA.  We discussed home treatment and return precautions.  Patient felt improved at time of discharge.  Amount and/or Complexity of Data Reviewed Labs: ordered.    Details: Per above  Risk Prescription drug management.           Final Clinical Impression(s) / ED Diagnoses Final  diagnoses:  Strep throat    Rx / DC Orders ED Discharge Orders     None         Victoriano Lain 02/27/23 2143    Lonell Grandchild, MD 03/02/23 0830

## 2023-02-24 NOTE — ED Triage Notes (Signed)
Pt presents after being sent from doctor's office for sore throat and dizziness x 6 days.  No fever.  No cough.  Had strep swab at physician's office.  Staff called to report that patient was orthostatic with them and they felt she needed IV fluids.

## 2023-02-24 NOTE — Discharge Instructions (Signed)
You have been treated with a long-acting antibiotic which should completely resolve your strep throat infection.  You may continue using your ibuprofen if desired.  You have also been given a dose of a steroid which should greatly improve your pain and tonsil swelling over the next 2 days.  I would suggest picking up Cepacol brand throat lozenges which will help you with your throat pain tremendously till his medications are working.  Make sure you are drinking plenty of fluids to avoid dehydration.

## 2023-03-25 ENCOUNTER — Telehealth: Payer: Self-pay

## 2023-03-25 NOTE — Telephone Encounter (Signed)
Completed 1st  Attempt to Hospital Follow up on 5.29.24 however pt was unavailable at the time of call.   No Voice Mail set up to leave message.  In addition, made a 2nd attempt on today to follow up with patient, with call being a Success.  Spoke to patient by way, daughter, to interpret dialogue  Pt stated she is improved and denies any current symptoms of illness of strep throat.   States she took all her meds prescribed per the ER visit and since then has had a followup visit with her PCP at the [facility] Dept as of 6.3.24, which was also a part of her previously scheduled  3 month follow up visit.   She states no current needs as relates to medical concerns/questions and/or lacking in any sociodeterminant health needs  Her Next PCP followup visit is scheduled for 06/16/23 at [facility]   PLAN _Pt was advised on protocol to using PCP versus Urgent Care/Emergency Room visist. _Pt was reminded to maintain her [employer] Program Enrollment which is due to expire on 04/15/2023.  Pt stated she will call back to our office to schedule prior to expiration  Overall pt was pleasant and agreed on above plan of action

## 2023-10-23 LAB — LAB REPORT - SCANNED
A1c: 9.2
Albumin, Urine POC: 0.8
Calcium: 9.5
TSH: 2.86 (ref 0.41–5.90)

## 2023-10-29 ENCOUNTER — Other Ambulatory Visit: Payer: Self-pay | Admitting: Obstetrics and Gynecology

## 2023-10-29 DIAGNOSIS — Z1231 Encounter for screening mammogram for malignant neoplasm of breast: Secondary | ICD-10-CM

## 2023-11-18 ENCOUNTER — Telehealth: Payer: Self-pay | Admitting: Nutrition

## 2023-11-18 ENCOUNTER — Encounter: Payer: Self-pay | Admitting: Nutrition

## 2023-11-18 ENCOUNTER — Encounter: Payer: Self-pay | Attending: *Deleted | Admitting: Nutrition

## 2023-11-18 VITALS — Ht 61.5 in | Wt 154.0 lb

## 2023-11-18 DIAGNOSIS — E118 Type 2 diabetes mellitus with unspecified complications: Secondary | ICD-10-CM | POA: Insufficient documentation

## 2023-11-18 NOTE — Progress Notes (Addendum)
Medical Nutrition Therapy  Appointment Start time:  [phone]  Appointment End time:  1015  Primary concerns today: Type 2 DM uncontrolled  Referral diagnosis: E11.8 Preferred learning style: Visual  Learning readiness: Ready   NUTRITION ASSESSMENT  53 yr old hispanic female referred from [facility] Department for uncontrolled Type 2 DM. Spanish translator is here with her. A1C 8.8%. Currently taking 20 units of Lantus, Janument, Trulicity and Glipizide. Added note. MOst recent A1C 10/2023 was 9.2%.  She notes she has changed some eating habits and cut down on tortillas and rice. Diet is low in vegetables. She is willing to work on more whole plant based foods and get in more exercise.  Clinical Medical Hx: Diabetes Type 2, HTN, Hyperlipidemia, Medications: Lantus, Lipitor, GLipizide, Trulicity, Baby aspirin, lisinopril, Janument Labs: 8.8% Notable Signs/Symptoms: Increased thirsty, frequent urination, tired  Lifestyle & Dietary Hx LIves with husband and daughter, Christiana Pellant mostly at home.  Estimated daily fluid intake: 80 oz Supplements:  Sleep: ok Stress / self-care: ' Current average weekly physical activity: walks some  24-Hr Dietary Recall First Meal: Egg and biscuit Snack:  Second Meal: rice, broth, vegetables, chicken, soup Snack:  Third Meal: PB with apple, water;  cereal or fruit, Snack: almonds Beverages: water  Estimated Energy Needs Calories: 1200 Carbohydrate: 135g Protein: 90g Fat: 33g   NUTRITION DIAGNOSIS  NB-1.1 Food and nutrition-related knowledge deficit As related to Uncontrolled Typd 2 Dm .  As evidenced by A1C 8.8%..   NUTRITION INTERVENTION  Nutrition education (E-1) on the following topics:  Nutrition and Diabetes education provided on My Plate, CHO counting, meal planning, portion sizes, timing of meals, avoiding snacks between meals unless having a low blood sugar, target ranges for A1C and blood sugars, signs/symptoms and treatment of  hyper/hypoglycemia, monitoring blood sugars, taking medications as prescribed, benefits of exercising 30 minutes per day and prevention of complications of DM.   Handouts Provided Include  Nutrition and Diabetes education provided on My Plate, CHO counting, meal planning, portion sizes, timing of meals, avoiding snacks between meals unless having a low blood sugar, target ranges for A1C and blood sugars, signs/symptoms and treatment of hyper/hypoglycemia, monitoring blood sugars, taking medications as prescribed, benefits of exercising 30 minutes per day and prevention of complications of DM.    Learning Style & Readiness for Change Teaching method utilized: Visual & Auditory  Demonstrated degree of understanding via: Teach Back  Barriers to learning/adherence to lifestyle change: none  Goals Established by Pt Goals  Eat three meals per day Increase vegetables with lunch and dinner Eat better balanced dinner daily Walk 30 minutes a day Test blood sugars twice a day. IF blood sugar is higher than 200's in morning, let provider know so they can increase your insulin.   MONITORING & EVALUATION Dietary intake, weekly physical activity, and blood sugars in 1 month. Recommend to call PCP and see about increasing Lantus to 25 units at night. Next Steps  Patient is to work on eating consistent meals.Marland Kitchen

## 2023-11-18 NOTE — Telephone Encounter (Signed)
TC to patient contact number. He husband answered the phone. Advised him to have her increase her insulin of Lantus at night to 25 units at night instead of 20 units to help bring down morning blood sugars less than 150 mg/dl. He verbalized understanding.

## 2023-11-18 NOTE — Patient Instructions (Signed)
Goals  Eat three meals per day Increase vegetables with lunch and dinner Eat better balanced dinner daily Walk 30 minutes a day Test blood sugars twice a day. IF blood sugar is higher than 200's in morning, let provider know so they can increase your insulin.

## 2023-11-20 ENCOUNTER — Encounter (HOSPITAL_COMMUNITY): Payer: Self-pay

## 2023-11-20 ENCOUNTER — Ambulatory Visit (HOSPITAL_COMMUNITY)
Admission: RE | Admit: 2023-11-20 | Discharge: 2023-11-20 | Disposition: A | Discharge status: Home / Self Care | Payer: Self-pay | Source: Ambulatory Visit | Attending: Obstetrics and Gynecology | Admitting: Obstetrics and Gynecology

## 2023-11-20 ENCOUNTER — Inpatient Hospital Stay: Payer: Self-pay | Attending: Obstetrics and Gynecology | Admitting: *Deleted

## 2023-11-20 ENCOUNTER — Other Ambulatory Visit: Payer: Self-pay

## 2023-11-20 VITALS — BP 117/77 | Wt 155.4 lb

## 2023-11-20 DIAGNOSIS — Z1231 Encounter for screening mammogram for malignant neoplasm of breast: Secondary | ICD-10-CM | POA: Insufficient documentation

## 2023-11-20 DIAGNOSIS — Z1211 Encounter for screening for malignant neoplasm of colon: Secondary | ICD-10-CM

## 2023-11-20 DIAGNOSIS — Z1239 Encounter for other screening for malignant neoplasm of breast: Secondary | ICD-10-CM

## 2023-11-20 NOTE — Progress Notes (Signed)
Ms. Quantina Kaitlyn Bell is a 53 y.o. female who presents to [facility] clinic today with no complaints.    Pap Smear: Pap smear not completed today. Last Pap smear was in 2023 at the [facility] clinic and was normal per patient. Per patient has no history of an abnormal Pap smear. Last Pap smear result is not available in Epic.   Physical exam: Breasts Breasts symmetrical. No skin abnormalities bilateral breasts. No nipple retraction bilateral breasts. No nipple discharge bilateral breasts. No lymphadenopathy. No lumps palpated bilateral breasts. No complaints of pain or tenderness on exam.  MS DIGITAL SCREENING TOMO BILATERAL Result Date: 07/09/2022 CLINICAL DATA:  Screening. EXAM: DIGITAL SCREENING BILATERAL MAMMOGRAM WITH TOMOSYNTHESIS AND CAD TECHNIQUE: Bilateral screening digital craniocaudal and mediolateral oblique mammograms were obtained. Bilateral screening digital breast tomosynthesis was performed. The images were evaluated with computer-aided detection. COMPARISON:  Previous exam(s). ACR Breast Density Category b: There are scattered areas of fibroglandular density. FINDINGS: There are no findings suspicious for malignancy. IMPRESSION: No mammographic evidence of malignancy. A result letter of this screening mammogram will be mailed directly to the patient. RECOMMENDATION: Screening mammogram in one year. (Code:SM-B-01Y) BI-RADS CATEGORY  1: Negative. Electronically Signed   By: Kaitlyn Bell M.D.   On: 07/09/2022 17:49   MS DIGITAL SCREENING TOMO BILATERAL Result Date: 02/27/2021 CLINICAL DATA:  Screening. EXAM: DIGITAL SCREENING BILATERAL MAMMOGRAM WITH TOMOSYNTHESIS AND CAD TECHNIQUE: Bilateral screening digital craniocaudal and mediolateral oblique mammograms were obtained. Bilateral screening digital breast tomosynthesis was performed. The images were evaluated with computer-aided detection. COMPARISON:  Previous exam(s). ACR Breast Density Category  c: The breast tissue is heterogeneously dense, which may obscure small masses. FINDINGS: There are no findings suspicious for malignancy. The images were evaluated with computer-aided detection. IMPRESSION: No mammographic evidence of malignancy. A result letter of this screening mammogram will be mailed directly to the patient. RECOMMENDATION: Screening mammogram in one year. (Code:SM-B-01Y) BI-RADS CATEGORY  1: Negative. Electronically Signed   By: Kaitlyn Bell M.D.   On: 02/27/2021 15:49   MS DIGITAL SCREENING TOMO BILATERAL Result Date: 12/26/2019 CLINICAL DATA:  Screening. EXAM: DIGITAL SCREENING BILATERAL MAMMOGRAM WITH TOMO AND CAD COMPARISON:  Previous exam(s). ACR Breast Density Category c: The breast tissue is heterogeneously dense, which may obscure small masses. FINDINGS: There are no findings suspicious for malignancy. Images were processed with CAD. IMPRESSION: No mammographic evidence of malignancy. A result letter of this screening mammogram will be mailed directly to the patient. RECOMMENDATION: Screening mammogram in one year. (Code:SM-B-01Y) BI-RADS CATEGORY  1: Negative. Electronically Signed   By: Kaitlyn Bell M.D.   On: 12/26/2019 17:07    Pelvic/Bimanual Pap is not indicated today per BCCCP guidelines.   Smoking History: Patient has never smoked.   Patient Navigation: Patient education provided. Access to services provided for patient through [facility] program. Spanish interpreter Lynnell Chad from [facility] provided.   Colorectal Cancer Screening: Per patient has never had colonoscopy completed. FIT Test given to patient to complete. No complaints today.    Breast and Cervical Cancer Risk Assessment: Patient does not have family history of breast cancer, known genetic mutations, or radiation treatment to the chest before age 51. Patient does not have history of cervical dysplasia, immunocompromised, or DES exposure in-utero.  Risk Scores as of Encounter on 11/20/2023     Kaitlyn Bell            5-year 0.94%   Lifetime 7.78%   This patient is [place] but has no documented birth  country, so the [place] model used data from [facility] patients to calculate their risk score. Document a birth country in the Demographics activity for a more accurate score.         Last calculated by Kaitlyn Rutherford, LPN on 5/40/9811 at 11:04 AM        A: BCCCP exam without pap smear No complaints.  P: Referred patient to [facility] for a screening mammogram. Appointment scheduled Friday, November 20, 2023 at 1130.  Kaitlyn Heidelberg, RN 11/20/2023 11:12 AM

## 2023-11-20 NOTE — Patient Instructions (Signed)
Explained breast self awareness with [name]. Patient did not need a Pap smear today due to last Pap smear was in 2023 per patient. Let her know BCCCP will cover Pap smears every 3 years unless has a history of abnormal Pap smears. Referred patient to [facility] for a screening mammogram. Appointment scheduled Friday, November 20, 2023 at 1130. Patient aware of appointment and will be there. Let patient know [facility] will follow up with her within the next couple weeks with results of mammogram by letter or phone. Kaitlyn Bell verbalized understanding.  Kaitlyn Bell, Kaitlyn Maser, RN 11:12 AM

## 2023-11-25 ENCOUNTER — Other Ambulatory Visit (HOSPITAL_COMMUNITY): Payer: Self-pay | Admitting: Obstetrics and Gynecology

## 2023-11-25 DIAGNOSIS — R928 Other abnormal and inconclusive findings on diagnostic imaging of breast: Secondary | ICD-10-CM

## 2023-11-28 LAB — FECAL OCCULT BLOOD, IMMUNOCHEMICAL: Fecal Occult Bld: NEGATIVE

## 2023-12-10 ENCOUNTER — Ambulatory Visit: Payer: Self-pay | Admitting: Nutrition

## 2024-01-05 LAB — LAB REPORT - SCANNED: EGFR: 115

## 2024-01-12 ENCOUNTER — Ambulatory Visit (HOSPITAL_COMMUNITY)
Admission: RE | Admit: 2024-01-12 | Discharge: 2024-01-12 | Disposition: A | Discharge status: Home / Self Care | Payer: Self-pay | Source: Ambulatory Visit | Attending: Obstetrics and Gynecology | Admitting: Obstetrics and Gynecology

## 2024-01-12 DIAGNOSIS — R928 Other abnormal and inconclusive findings on diagnostic imaging of breast: Secondary | ICD-10-CM

## 2024-03-08 ENCOUNTER — Telehealth: Payer: Self-pay

## 2024-03-08 NOTE — Telephone Encounter (Signed)
 Attempted follow up call with interpreter services today. She is [facility] client. She was scheduled for an appointment at [facility] today.  No Answer, left message requesting return call to learn more about our services.  Last A1C was 9.6 on 01/04/24 Most recent eGFR 115 on 01/05/24 and most recent urine albumin 0.8 on 10/23/23  Will mail case management diabetes welcome letter in Spanish as well as [facility] welcome letter. Will mail diabetic MyPlate information also. Noted client had met with [facility]ian Penny Crumpton on 11/18/23 for nutrition education.  Will plan to continue to monitor labs and provider notes and recommendations and attempt to connect with client for diabetes support and education.   Kris Pester RN [employer]

## 2024-06-16 ENCOUNTER — Telehealth: Payer: Self-pay

## 2024-06-16 NOTE — Telephone Encounter (Signed)
 Attempted call for follow up to [facility]/ [facility] client with interpreter. NO answer left voicemail.  Call was to follow up for DM support as well as any needs client may have with [employer].  Most recent visit on 06/09/24 with [facility] A1C up some on 06/09/24 to 8.9 from 8.2 previously.  No next appointment scheduled due to client is planning to travel to [place] from 08/2024-10/2024 Notes reviewed. Need assessed for better diet compliance. Provider increased Lantus from25 units daily to 30 units other mediations unchanged   Will continue to follow for support and any potential needs. Will attempt to connect by phone or letter prior to November 2025.  Avelina JONELLE Skeen RN [employer]
